# Patient Record
Sex: Female | Born: 2000 | Race: White | Hispanic: No | Marital: Single
Health system: Southern US, Community
[De-identification: ages and names within clinical notes are randomized; demographics above are authoritative.]

---
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
## 2022-09-21 ENCOUNTER — Ambulatory Visit (INDEPENDENT_AMBULATORY_CARE_PROVIDER_SITE_OTHER): Payer: Medicaid Other | Admitting: Podiatry

## 2022-09-21 DIAGNOSIS — M25571 Pain in right ankle and joints of right foot: Secondary | ICD-10-CM

## 2022-09-21 DIAGNOSIS — R600 Localized edema: Secondary | ICD-10-CM

## 2022-09-21 DIAGNOSIS — M25572 Pain in left ankle and joints of left foot: Secondary | ICD-10-CM

## 2022-09-21 NOTE — Progress Notes (Signed)
    Chief Complaint  Patient presents with   Foot Problem    Patient want seen at the hospital for swelling of 5th toe to right foot and 3rd toe to the left foot x 1 month ago. Patient was given prednisone and it helped with swelling but the pain is still consistent. Patient is unable to bend her toes inwards. Pain is throbbing and pins a needles sensation.     HPI: 22 y.o. female presenting today noting that approximately 1 month ago she had went to the ER with concern of swelling and pain in the right fifth toe and left third toe.  She states that x-rays were normal and bloodwork was normal.  She did pull up her CMP, CBC results on her phone for me to view.  States that she was placed on steroids at that time and the swelling has improved, but it has not completely resolved.  She still gets some various arthralgias throughout the foot bilaterally.  She has never had this happen before.  No family history of this.  She still has some difficulty bending her toes / curling her toes.  History reviewed. No pertinent past medical history.  History reviewed. No pertinent surgical history.  No Known Allergies  Review of Systems  Musculoskeletal:  Positive for joint pain.       With toe swelling x 2 digits     Physical Exam:  General: The patient is alert and oriented x3 in no acute distress.  Dermatology: Skin is warm, dry and supple bilateral lower extremities. Interspaces are clear of maceration and debris.  Localized edema to right 5th and left 3rd toes.  No discoloration in those toes.  Skin intact.  Vascular: Palpable pedal pulses bilaterally. Capillary refill within normal limits.  No erythema or calor.  Neurological: Light touch sensation grossly intact bilateral feet.   Musculoskeletal Exam:  There is pain on palpation mostly in left 3rd toe near PIPJ with milder pain in right 5th toe. Decreased toe flexion at mpj's.    Assessment/Plan of Care: 1. Pain in joints of both feet    2. Localized edema     Discussed clinical findings with patient today.  Will set up for bloodwork to be done to r/o connective tissue disorders, inflammatory arthropathies, and Lyme disease.    F/u after bloodwork has been completed.   Clerance Lav, DPM, FACFAS [facility]     [address] [place], [place] [address]                Office [phone]  Fax [phone]

## 2022-09-24 ENCOUNTER — Encounter: Payer: Self-pay | Admitting: Podiatry

## 2022-10-19 ENCOUNTER — Ambulatory Visit: Payer: MEDICAID | Admitting: Podiatry

## 2022-11-16 ENCOUNTER — Ambulatory Visit: Payer: MEDICAID | Admitting: Podiatry

## 2023-05-09 ENCOUNTER — Other Ambulatory Visit (HOSPITAL_BASED_OUTPATIENT_CLINIC_OR_DEPARTMENT_OTHER): Payer: Self-pay | Admitting: Family Medicine

## 2023-05-09 ENCOUNTER — Encounter (HOSPITAL_BASED_OUTPATIENT_CLINIC_OR_DEPARTMENT_OTHER): Payer: Self-pay | Admitting: Family Medicine

## 2023-05-09 DIAGNOSIS — R350 Frequency of micturition: Secondary | ICD-10-CM

## 2023-05-09 DIAGNOSIS — R102 Pelvic and perineal pain: Secondary | ICD-10-CM

## 2023-05-09 DIAGNOSIS — R109 Unspecified abdominal pain: Secondary | ICD-10-CM

## 2023-05-12 ENCOUNTER — Other Ambulatory Visit (HOSPITAL_BASED_OUTPATIENT_CLINIC_OR_DEPARTMENT_OTHER): Payer: MEDICAID | Admitting: Radiology

## 2023-05-12 ENCOUNTER — Encounter (HOSPITAL_BASED_OUTPATIENT_CLINIC_OR_DEPARTMENT_OTHER): Payer: Self-pay

## 2023-05-16 ENCOUNTER — Encounter (HOSPITAL_BASED_OUTPATIENT_CLINIC_OR_DEPARTMENT_OTHER): Payer: Self-pay

## 2023-05-17 ENCOUNTER — Inpatient Hospital Stay (HOSPITAL_BASED_OUTPATIENT_CLINIC_OR_DEPARTMENT_OTHER): Admission: RE | Admit: 2023-05-17 | Payer: MEDICAID | Source: Ambulatory Visit | Admitting: Radiology
